# Patient Record
Sex: Male | Born: 1955 | Race: White | Hispanic: No | Marital: Single | Smoking: Never smoker
Health system: Southern US, Community
[De-identification: ages and names within clinical notes are randomized; demographics above are authoritative.]

## PROBLEM LIST (undated history)

## (undated) DIAGNOSIS — R569 Unspecified convulsions: Secondary | ICD-10-CM

## (undated) HISTORY — DX: Unspecified convulsions: R56.9

## (undated) HISTORY — PX: HAND SURGERY: SHX662

## (undated) HISTORY — PX: KNEE SURGERY: SHX244

---

## 1997-07-07 ENCOUNTER — Inpatient Hospital Stay (HOSPITAL_COMMUNITY): Admission: EM | Admit: 1997-07-07 | Discharge: 1997-07-09 | Payer: Self-pay | Admitting: Emergency Medicine

## 2004-09-30 ENCOUNTER — Inpatient Hospital Stay: Payer: Self-pay | Admitting: Internal Medicine

## 2004-09-30 ENCOUNTER — Other Ambulatory Visit: Payer: Self-pay

## 2006-06-01 IMAGING — CR ORBITS FOR FOREIGN BODY - 2 VIEW
1 series · 3 of 3 positions shown · non-contrast
Comparison: none

REASON FOR EXAM: MRI- Metal Shaving in Eye
COMMENTS:

PROCEDURE:     DXR - DXR ORBITS FOR MRI CLEARANCE  - October 03, 2004  [DATE]
RESULT:        No metallic foreign body is noted.   The patient has been
cleared for MRI.

[Series 1: view not recorded · 0.17mm/px · 3 of 3 slices shown]
[im 1/3]
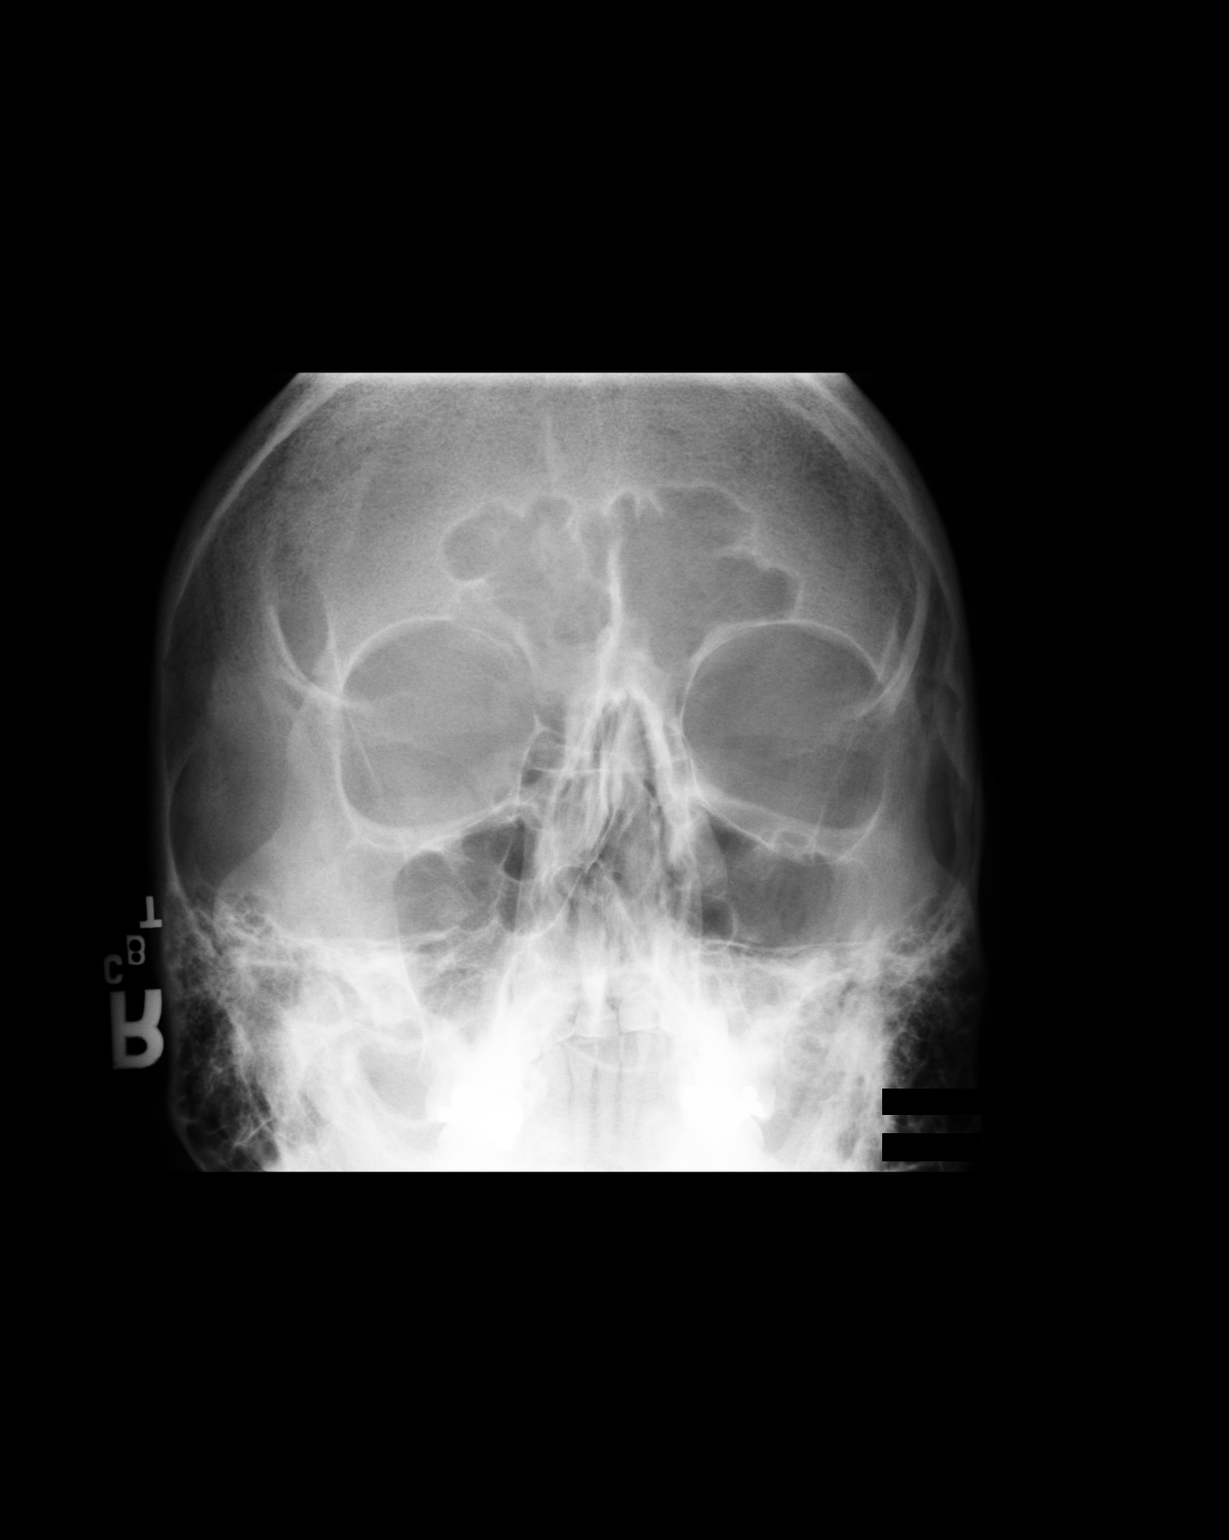
[im 2/3]
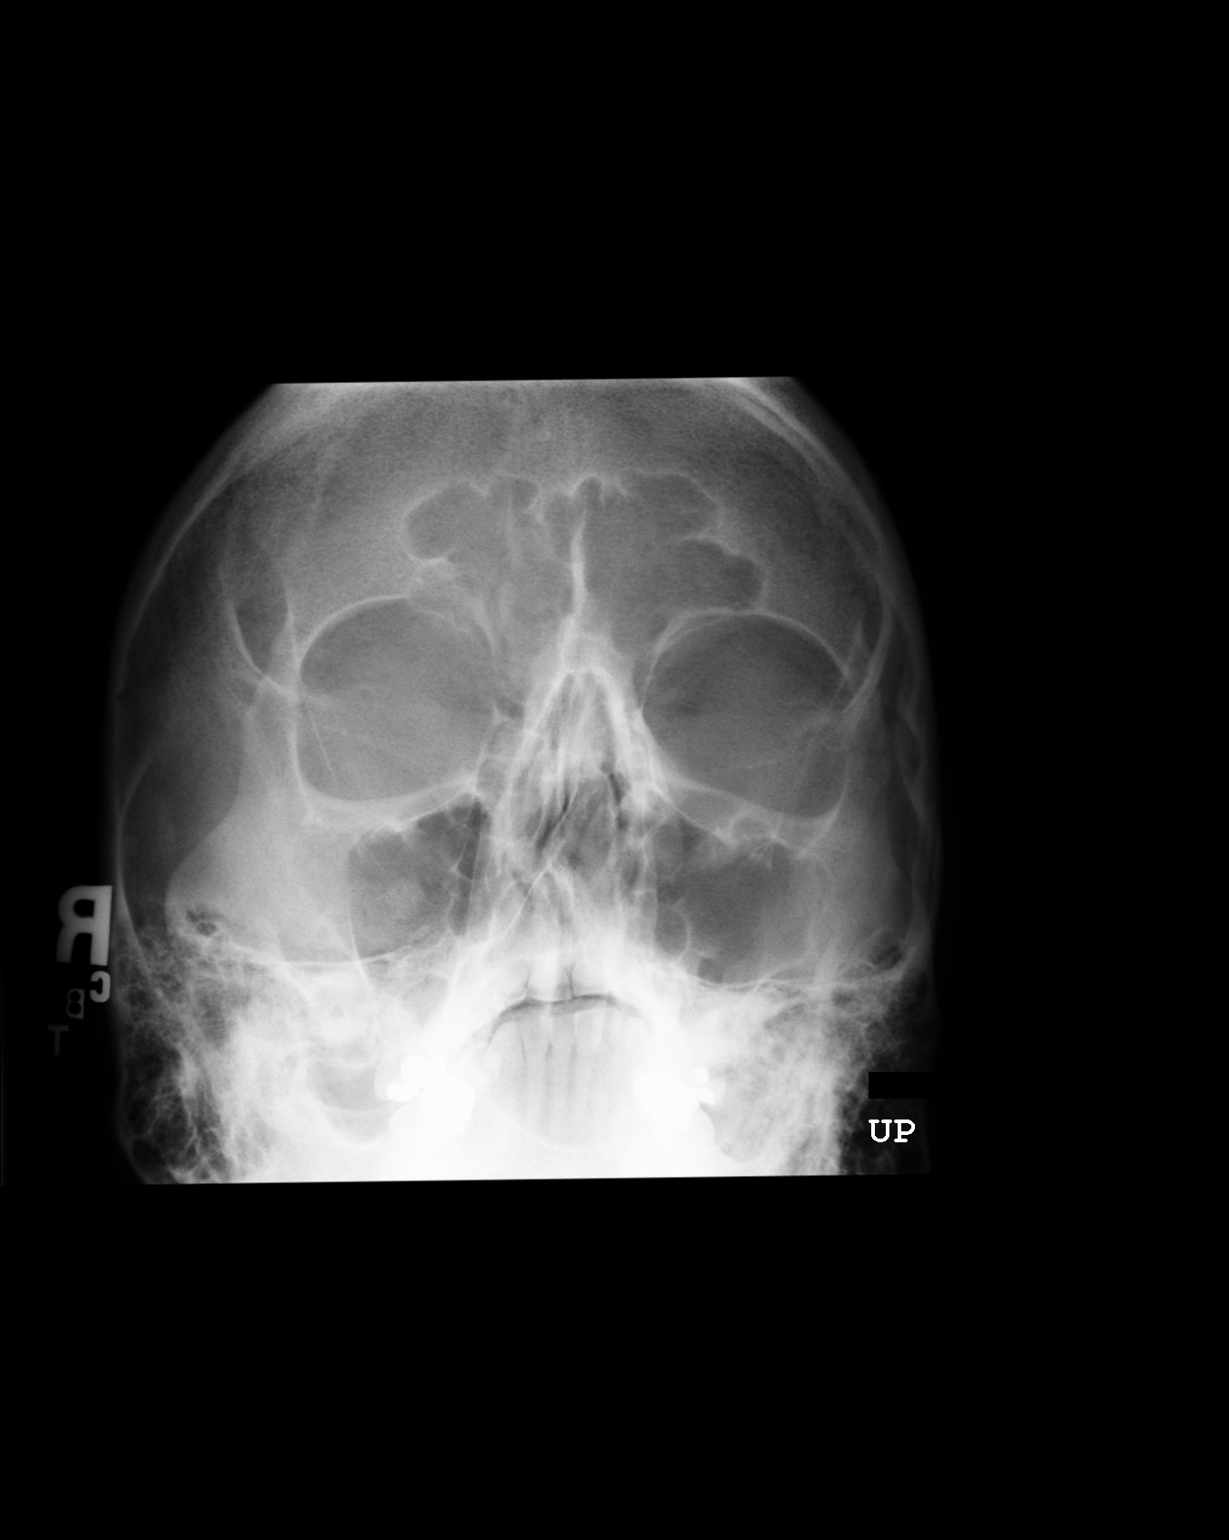
[im 3/3]
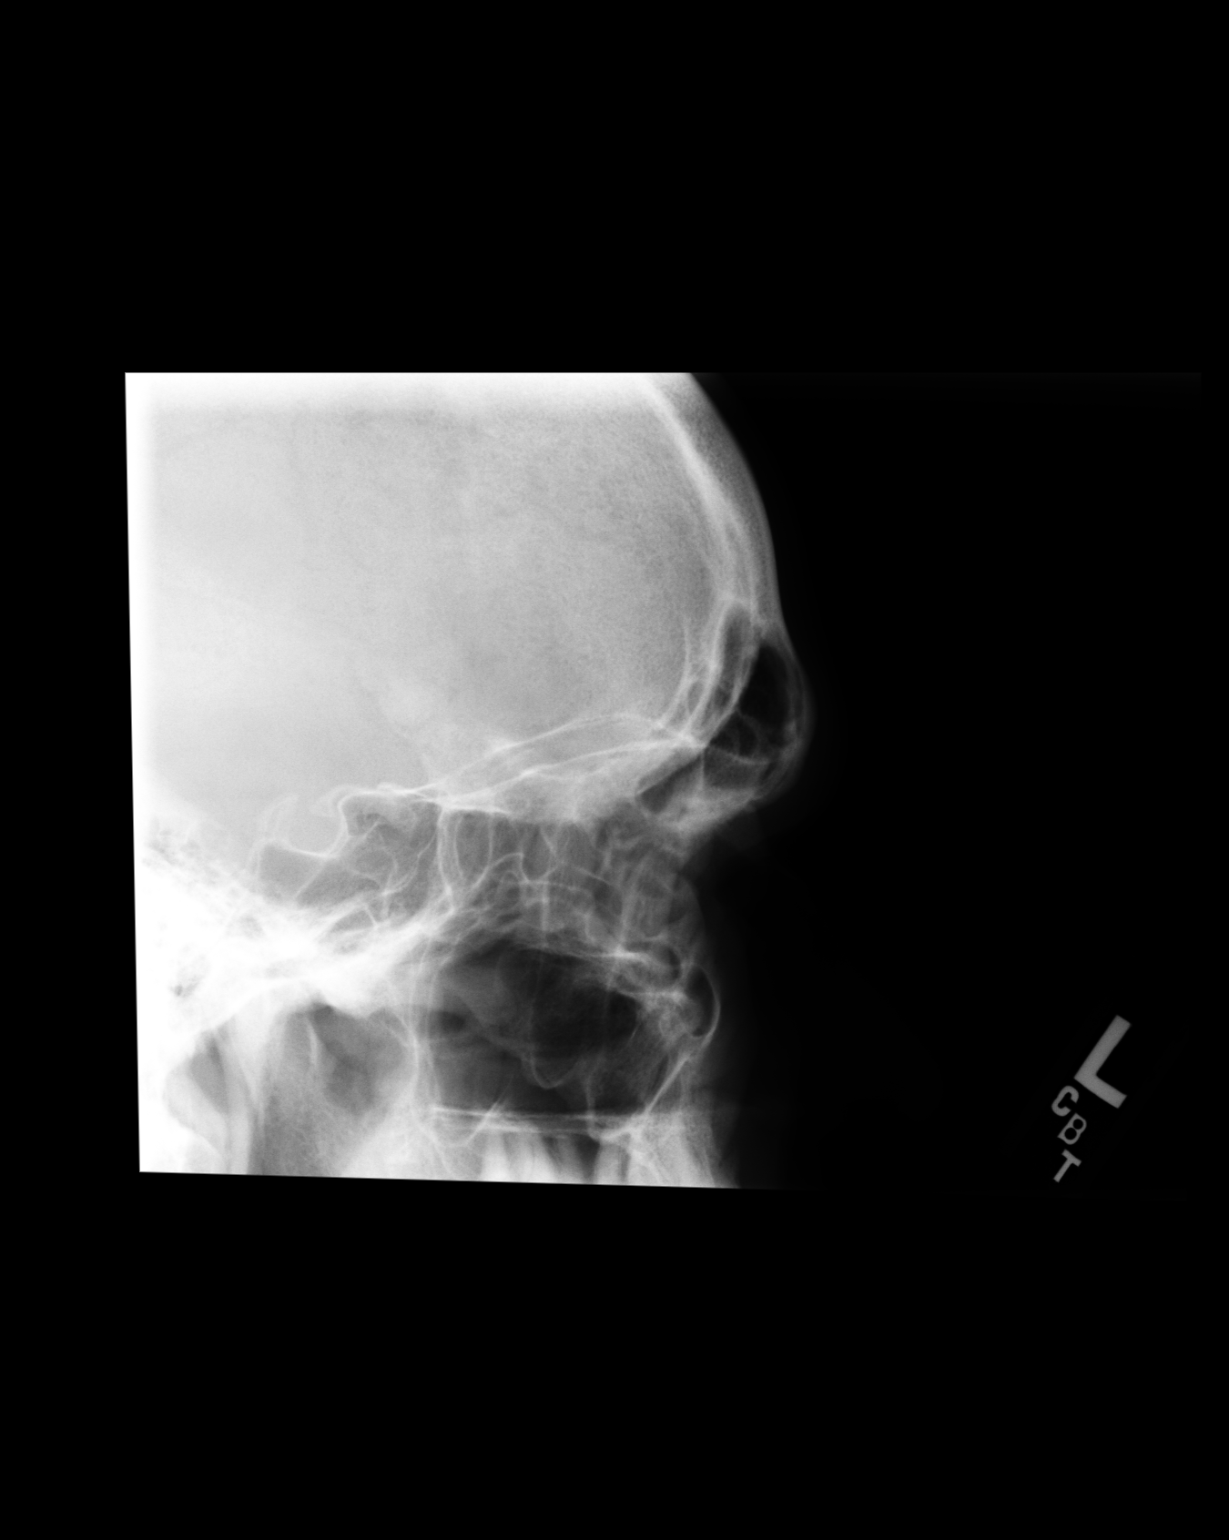

[3 of 3 positions shown; findings below may reference images not displayed]

IMPRESSION: See above.

## 2007-09-01 ENCOUNTER — Emergency Department (HOSPITAL_COMMUNITY): Admission: EM | Admit: 2007-09-01 | Discharge: 2007-09-01 | Payer: Self-pay | Admitting: Emergency Medicine

## 2010-04-26 ENCOUNTER — Encounter: Payer: Self-pay | Admitting: Family Medicine

## 2010-04-26 ENCOUNTER — Ambulatory Visit (INDEPENDENT_AMBULATORY_CARE_PROVIDER_SITE_OTHER): Payer: Medicare Other | Admitting: Family Medicine

## 2010-04-26 DIAGNOSIS — J0100 Acute maxillary sinusitis, unspecified: Secondary | ICD-10-CM | POA: Insufficient documentation

## 2010-05-05 NOTE — Assessment & Plan Note (Signed)
Summary: NOV sinusitis   Vital Signs:  Patient profile:   55 year old male Height:      69 inches Weight:      161 pounds BMI:     23.86 O2 Sat:      99 % on Room air Pulse rate:   82 / minute BP sitting:   148 / 93  (left arm) Cuff size:   large  Vitals Entered By: Payton Spark CMA (April 26, 2010 2:16 PM)  O2 Flow:  Room air CC: New to est. C/O nasal congestion and cough x 10 days.    Primary Care Provider:  Seymour Bars DO  CC:  New to est. C/O nasal congestion and cough x 10 days. Marland Kitchen  History of Present Illness: 55 yo WM presents for NOV.  He has a hx of seizure d/o since 2006.  He sees Dr Tera Helper at [facility] and is doing well, seizure - free x 2 yrs on Cabatrol and Zonegran.  He is a disabled Curator x 2 yrs.  He has hx of allergies but has not been taking anything.  About 10 days ago, he developed increased sinus congestion, HA and facial pain along iwth hallitosis and a thick colored rhinorrhea.  He has had sore throat, postnasal dip and rhinorrhea.  Ran a fever of 100 last weekend.      Current Medications (verified): 1)  Carbatrol 200 Mg Xr12h-Cap (Carbamazepine) .... Take 1 Cap By Mouth Every Morning and 2 Caps Every Evening 2)  Zonegran 100 Mg Caps (Zonisamide) .... Take 2 Caps By Mouth Every Morning and 2 Caps By Mouth Every Evening.  Allergies (verified): No Known Drug Allergies  Past History:  Past Medical History: seizure d/o  Past Surgical History: none  Family History: father died at 23 from AMI mother died of liver cancer sister healthy 1 brother with CAD < 56, other brother healthy  Social History: 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 Never smoked. Denies ETOH walks dog. Fair diet.  Review of Systems       no fevers/sweats/weakness, unexplained wt loss/gain, no change in vision, no difficulty hearing, ringing in ears, + hay fever/allergies, no CP/discomfort, no palpitations, no breast lump/nipple discharge, + cough/wheeze,  no blood in stool, no N/V/D, no nocturia, no leaking urine, no unusual vag bleeding, no vaginal/penile discharge, + muscle/joint pain, no rash, no new/changing mole, + HA, no memory loss, no anxiety, no sleep problem, no depression, no unexplained lumps, no easy bruising/bleeding, no concern with sexual function    Physical Exam  General:  alert, well-developed, well-nourished, and well-hydrated.   Head:  [place]/AT; maxillary sinus TTP Eyes:  conjunctiva clear Ears:  EACs patent; TMs translucent and gray with good cone of light and bony landmarks. on the L with cerumen impaction on the R Nose:  boggy turbinates, rhinorrhea Mouth:  pharynx pink and moist.  no injjection, hallitosis Neck:  no masses.   Lungs:  Normal respiratory effort, chest expands symmetrically. Lungs are clear to auscultation, no crackles or wheezes. Heart:  Normal rate and regular rhythm. S1 and S2 normal without gallop, murmur, click, rub or other extra sounds. Skin:  color normal.   Cervical Nodes:  shoddy anterior cervical chain LA   Impression & Recommendations:  Problem # 1:  ACUTE MAXILLARY SINUSITIS (ICD-461.0) Treat with Augmentin x 10 days along with Flonase daily x 2 wks and Benzonate for cough.   Avoid OTC cold meds with seizure d/o. RTC for f/u in 2  wks. His updated medication list for this problem includes:    Fluticasone Propionate 50 Mcg/act Susp (Fluticasone propionate) .Marland Kitchen... 2 sprays/ nostril daily    Amoxicillin-pot Clavulanate 875-125 Mg Tabs (Amoxicillin-pot clavulanate) .Marland Kitchen... 1 tab by mouth two times a day x 10 days    Benzonatate 200 Mg Caps (Benzonatate) .Marland Kitchen... 1 capsule by mouth three times a day as needed cough  Complete Medication List: 1)  Carbatrol 200 Mg Xr12h-cap (Carbamazepine) .... Take 1 cap by mouth every morning and 2 caps every evening 2)  Zonegran 100 Mg Caps (Zonisamide) .... Take 2 caps by mouth every morning and 2 caps by mouth every evening. 3)  Fluticasone Propionate 50 Mcg/act  Susp (Fluticasone propionate) .... 2 sprays/ nostril daily 4)  Amoxicillin-pot Clavulanate 875-125 Mg Tabs (Amoxicillin-pot clavulanate) .Marland Kitchen.. 1 tab by mouth two times a day x 10 days 5)  Benzonatate 200 Mg Caps (Benzonatate) .Marland Kitchen.. 1 capsule by mouth three times a day as needed cough  Patient Instructions: 1)  For sinusitis: 2)  Take 10 days of generic Augmentin -- one tab with breakfast and one tab with dinner. 3)  Use Flonase 2 sprays/ nostril once daily for the next 2 wks. 4)  Use Benzonate as needed for cough. 5)  Avoid OTC cold meds. 6)  Return for f/u in 2 wks. Prescriptions: BENZONATATE 200 MG CAPS (BENZONATATE) 1 capsule by mouth three times a day as needed cough  #30 x 0   Entered and Authorized by:   Seymour Bars DO   Signed by:   Seymour Bars DO on 04/26/2010   Method used:   Electronically to        [id number]* (retail)       [address], [place]  [address]       Ph: [phone]       Fax: [phone]   RxID:   [id number] AMOXICILLIN-POT CLAVULANATE 875-125 MG TABS (AMOXICILLIN-POT CLAVULANATE) 1 tab by mouth two times a day x 10 days  #20 x 0   Entered and Authorized by:   Seymour Bars DO   Signed by:   Seymour Bars DO on 04/26/2010   Method used:   Electronically to        [id number]* (retail)       [address], [place]  [address]       Ph: [phone]       Fax: [phone]   RxID:   [id number] FLUTICASONE PROPIONATE 50 MCG/ACT SUSP (FLUTICASONE PROPIONATE) 2 sprays/ nostril daily  #1 bottle x 2   Entered and Authorized by:   Seymour Bars DO   Signed by:   Seymour Bars DO on 04/26/2010   Method used:   Electronically to        [id number]* (retail)       [address], [place]  [address]       Ph: [phone]       Fax: [phone]   RxID:   [id number]    Orders Added: 1)  New Patient Level III [51761]

## 2010-05-10 ENCOUNTER — Encounter: Payer: Self-pay | Admitting: Family Medicine

## 2010-05-10 ENCOUNTER — Ambulatory Visit (INDEPENDENT_AMBULATORY_CARE_PROVIDER_SITE_OTHER): Payer: Medicare Other | Admitting: Family Medicine

## 2010-05-10 DIAGNOSIS — R030 Elevated blood-pressure reading, without diagnosis of hypertension: Secondary | ICD-10-CM | POA: Insufficient documentation

## 2010-05-10 DIAGNOSIS — J0100 Acute maxillary sinusitis, unspecified: Secondary | ICD-10-CM

## 2010-05-17 NOTE — Letter (Signed)
Summary: Intake Forms  Intake Forms   Imported By: Lanelle Bal 05/12/2010 10:38:27  _____________________________________________________________________  External Attachment:    Type:   Image     Comment:   External Document

## 2010-05-17 NOTE — Assessment & Plan Note (Signed)
Summary: F/U BP/ LABS   Vital Signs:  Patient profile:   55 year old male Height:      69 inches Weight:      159 pounds BMI:     23.57 O2 Sat:      100 % on Room air Pulse rate:   81 / minute BP sitting:   131 / 88  (left arm) Cuff size:   large  Vitals Entered By: Payton Spark CMA (May 10, 2010 9:44 AM)  O2 Flow:  Room air CC: F/U. Feeling much better   Primary Care Provider:  Seymour Bars DO  CC:  F/U. Feeling much better.  History of Present Illness: 55 year old male presents for follow up sinusitis and BP check.  He states his symptoms of headache, cough, and thick drainage have cleared up but he is still having mild rhinitis.  However, he is still using the flonase and is very happy with its effect.  He finished his course of antibiotics.    Doing well with seizure meds- has f/u with [facility].  Due for fasting labs and wants to set up a physical.  Current Medications (verified): 1)  Carbatrol 200 Mg Xr12h-Cap (Carbamazepine) .... Take 1 Cap By Mouth Every Morning and 2 Caps Every Evening 2)  Zonegran 100 Mg Caps (Zonisamide) .... Take 2 Caps By Mouth Every Morning and 2 Caps By Mouth Every Evening. 3)  Fluticasone Propionate 50 Mcg/act Susp (Fluticasone Propionate) .... 2 Sprays/ Nostril Daily  Allergies (verified): No Known Drug Allergies  Past History:  Past Medical History: Reviewed history from 04/26/2010 and no changes required. seizure d/o  Past Surgical History: Reviewed history from 04/26/2010 and no changes required. none  Social History: Reviewed history from 04/26/2010 and no changes required. 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 Never smoked. Denies ETOH walks dog. Fair diet.  Review of Systems      See HPI  Physical Exam  General:  alert, well-developed, and well-nourished.   Head:  normocephalic and atraumatic.  sinususes NTTP Eyes:  pupils equal, pupils round, and pupils reactive to light.  Conjuctiva  clear. Ears:  Right TM normal with good cone of light and visible bony landmarks.  Unable to visualize left TM due to cerumen. Nose:  Mild clear discharge but no erythema. Mouth:  Oropharynx pink and moist without exudate. Lungs:  normal respiratory effort, normal breath sounds, no crackles, and no wheezes.  normal respiratory effort, normal breath sounds, no crackles, and no wheezes.   Heart:  normal rate and regular rhythm.  normal rate and regular rhythm.   Pulses:  R radial normal and L radial normal.  R radial normal and L radial normal.   Cervical Nodes:  no anterior cervical adenopathy and no posterior cervical adenopathy.  no anterior cervical adenopathy and no posterior cervical adenopathy.     Impression & Recommendations:  Problem # 1:  ACUTE MAXILLARY SINUSITIS (ICD-461.0) Resolved with mild rhinitis.  Finished antibiotics, still on fluticasone nasal spray.  Can remain on nasal steroid as this appears to be beneficial to him.  The following medications were removed from the medication list:    Amoxicillin-pot Clavulanate 875-125 Mg Tabs (Amoxicillin-pot clavulanate) .Marland Kitchen... 1 tab by mouth two times a day x 10 days    Benzonatate 200 Mg Caps (Benzonatate) .Marland Kitchen... 1 capsule by mouth three times a day as needed cough His updated medication list for this problem includes:    Fluticasone Propionate 50 Mcg/act Susp (Fluticasone  propionate) .Marland Kitchen... 2 sprays/ nostril daily  Problem # 2:  ELEVATED BLOOD PRESSURE (ICD-796.2) BP has improved now that he is off cold meds and his sinusitis has cleared. Will update his fasting labs today and set up a CPE in 2 mos.  Complete Medication List: 1)  Carbatrol 200 Mg Xr12h-cap (Carbamazepine) .... Take 1 cap by mouth every morning and 2 caps every evening 2)  Zonegran 100 Mg Caps (Zonisamide) .... Take 2 caps by mouth every morning and 2 caps by mouth every evening. 3)  Fluticasone Propionate 50 Mcg/act Susp (Fluticasone propionate) .... 2 sprays/  nostril daily  Other Orders: T-Comprehensive Metabolic Panel ([id number]) T-PSA Total (Medicare Screen Only) [phone]) T-Lipid Profile (29528-41324)  Patient Instructions: 1)  Update fasting labs downstairs today. 2)  Will call you w/ result tomorrow. 3)  Return for a physical in 2 mos. 4)  I am glad you are feeling better!!   Orders Added: 1)  T-Comprehensive Metabolic Panel [[id number]] 2)  T-PSA Total (Medicare Screen Only) [[id number]] 3)  T-Lipid Profile [[id number]] 4)  Est. Patient Level III [40102]

## 2010-05-18 ENCOUNTER — Other Ambulatory Visit: Payer: Self-pay | Admitting: *Deleted

## 2010-05-18 ENCOUNTER — Telehealth: Payer: Self-pay | Admitting: Family Medicine

## 2010-05-18 ENCOUNTER — Encounter: Payer: Self-pay | Admitting: Family Medicine

## 2010-05-18 DIAGNOSIS — E785 Hyperlipidemia, unspecified: Secondary | ICD-10-CM

## 2010-05-18 LAB — CONVERTED CEMR LAB
ALT: 12 units/L (ref 0–53)
CO2: 25 meq/L (ref 19–32)
Calcium: 9.7 mg/dL (ref 8.4–10.5)
Chloride: 107 meq/L (ref 96–112)
Cholesterol: 223 mg/dL — ABNORMAL HIGH (ref 0–200)
Creatinine, Ser: 1.33 mg/dL (ref 0.40–1.50)
Glucose, Bld: 104 mg/dL — ABNORMAL HIGH (ref 70–99)
Sodium: 139 meq/L (ref 135–145)
Total Bilirubin: 0.5 mg/dL (ref 0.3–1.2)
Total Protein: 7.5 g/dL (ref 6.0–8.3)
Triglycerides: 128 mg/dL (ref <150)
VLDL: 26 mg/dL (ref 0–40)

## 2010-05-18 LAB — COMPREHENSIVE METABOLIC PANEL
Albumin: 5 g/dL (ref 3.5–5.2)
BUN: 21 mg/dL (ref 6–23)
CO2: 25 mEq/L (ref 19–32)
Calcium: 9.7 mg/dL (ref 8.4–10.5)
Glucose, Bld: 104 mg/dL — ABNORMAL HIGH (ref 70–99)
Potassium: 4.4 mEq/L (ref 3.5–5.3)
Sodium: 139 mEq/L (ref 135–145)
Total Protein: 7.5 g/dL (ref 6.0–8.3)

## 2010-05-18 LAB — LIPID PANEL
Cholesterol: 223 mg/dL — ABNORMAL HIGH (ref 0–200)
Total CHOL/HDL Ratio: 5.2 Ratio
Triglycerides: 128 mg/dL (ref <150)
VLDL: 26 mg/dL (ref 0–40)

## 2010-05-18 LAB — PSA, MEDICARE: PSA: 0.93 ng/mL (ref <=4.00)

## 2010-05-18 MED ORDER — PRAVASTATIN SODIUM 40 MG PO TABS: 40.0000 mg | ORAL_TABLET | Freq: Every day | ORAL | Status: DC

## 2010-05-18 NOTE — Telephone Encounter (Signed)
Pt aware of the above  

## 2010-05-18 NOTE — Telephone Encounter (Signed)
Pls let p tknow that his cholesterol was high and I started him on Pravastatin at bedtime, pick up from pharmacy. Call if any problems, o/w will recheck lab in 2 mos.

## 2010-06-20 ENCOUNTER — Telehealth: Payer: Self-pay | Admitting: Family Medicine

## 2010-06-20 NOTE — Telephone Encounter (Signed)
Pls let pt know that his prostate cancer screen came back normal.  His cholesterol is still high.  Pls ask if he's been taking Pravastatin every night.  Sguar is 104 in the pre diabetic range with normal liver and kidney function.  Work on [employer], regular exercise.  Repeat sugar and lipids in 6 mos.

## 2010-06-21 NOTE — Telephone Encounter (Signed)
LMOM informing Pt of the above 

## 2010-07-05 ENCOUNTER — Encounter: Payer: Self-pay | Admitting: Family Medicine

## 2010-07-05 ENCOUNTER — Ambulatory Visit (INDEPENDENT_AMBULATORY_CARE_PROVIDER_SITE_OTHER): Payer: Medicare Other | Admitting: Family Medicine

## 2010-07-05 VITALS — BP 120/81 | HR 62 | Temp 98.7°F | Ht 69.0 in | Wt 165.0 lb

## 2010-07-05 DIAGNOSIS — Z1211 Encounter for screening for malignant neoplasm of colon: Secondary | ICD-10-CM

## 2010-07-05 DIAGNOSIS — Z125 Encounter for screening for malignant neoplasm of prostate: Secondary | ICD-10-CM

## 2010-07-05 DIAGNOSIS — Z23 Encounter for immunization: Secondary | ICD-10-CM

## 2010-07-05 DIAGNOSIS — Z0000 Encounter for general adult medical examination without abnormal findings: Secondary | ICD-10-CM

## 2010-07-05 DIAGNOSIS — E785 Hyperlipidemia, unspecified: Secondary | ICD-10-CM

## 2010-07-05 MED ORDER — PNEUMOCOCCAL VAC POLYVALENT 25 MCG/0.5ML IJ INJ: 0.5000 mL | INJECTION | Freq: Once | INTRAMUSCULAR | Status: DC

## 2010-07-05 MED ORDER — TETANUS-DIPHTH-ACELL PERTUSSIS 5-2.5-18.5 LF-MCG/0.5 IM SUSP: 0.5000 mL | Freq: Once | INTRAMUSCULAR | Status: DC

## 2010-07-05 NOTE — Progress Notes (Signed)
Subjective:    Patient ID: Tyler Carlson, male    DOB: 03-30-1955, 55 y.o.   MRN: [id number]  HPI 55  yo WM presents for CPE.  He is healthy w/o any complaints.  He has a + fam hx for premature heart dz but neg for prostate cancer or colon cancer.    His last labwork was in March, started on cholesterol meds and had a glucose in the IFG range. His last tetanus vaccine was > 10 yrs ago, no known PNX with seizure d/o.  His last colonoscopy was never.  See scanned in medicare wellness form.    BP 120/81  Pulse 62  Temp(Src) 98.7 F (37.1 C) (Oral)  Ht 5\' 9"  (1.753 m)  Wt 165 lb (74.844 kg)  BMI 24.37 kg/m2  SpO2 99%  Past Medical History  Diagnosis Date  . Seizures     History reviewed. No pertinent past surgical history.  Family History  Problem Relation Age of Onset  . Cancer Mother   . Heart disease Father   . Heart disease Brother 56    CAD    History   Social History  . Marital Status: Single    Spouse Name: N/A    Number of Children: N/A  . Years of Education: N/A   Occupational History  . Not on file.   Social History Main Topics  . Smoking status: Never Smoker   . Smokeless tobacco: Not on file  . Alcohol Use: No  . Drug Use: Not on file  . Sexually Active: Not on file   Other Topics Concern  . Not on file   Social History Narrative  . No narrative on file    Allergies not on file  Current outpatient prescriptions:carbamazepine (CARBATROL) 200 MG 12 hr capsule, 2 (two) times daily. Take 1 cap po every morning and 2 caps every evening , Disp: , Rfl: ;  fluticasone (FLONASE) 50 MCG/ACT nasal spray, 2 sprays by Nasal route daily.  , Disp: , Rfl: ;  pravastatin (PRAVACHOL) 40 MG tablet, Take 1 tablet (40 mg total) by mouth at bedtime., Disp: 30 tablet, Rfl: 2 zonisamide (ZONEGRAN) 100 MG capsule, Take 2 caps po every morning and 2 caps po every evening , Disp: , Rfl:     Review of Systems  Constitutional: Negative for fatigue and unexpected  weight change.  Eyes: Negative for visual disturbance.  Respiratory: Negative for shortness of breath.   Cardiovascular: Negative for chest pain, palpitations and leg swelling.  Gastrointestinal: Positive for constipation. Negative for nausea, vomiting, diarrhea and blood in stool.  Genitourinary: Negative for difficulty urinating.  Neurological: Negative for headaches.  Psychiatric/Behavioral: Negative for dysphoric mood. The patient is not nervous/anxious.        Objective:   Physical Exam  Constitutional: He appears well-developed and well-nourished. No distress.  HENT:  Head: Normocephalic and atraumatic.  Eyes: Conjunctivae are normal. No scleral icterus.  Neck: Neck supple. No thyromegaly present.  Cardiovascular: Normal rate, regular rhythm, normal heart sounds and intact distal pulses.   No murmur heard. Pulmonary/Chest: Effort normal and breath sounds normal. No respiratory distress.  Abdominal: Soft. Bowel sounds are normal. He exhibits no distension and no mass. There is no guarding.       No aa bruits  Genitourinary: Rectum normal and prostate normal. Guaiac negative stool.  Musculoskeletal: He exhibits no edema.  Lymphadenopathy:    He has no cervical adenopathy.  Skin: Skin is warm and dry.  Psychiatric: He  has a normal mood and affect.          Assessment & Plan:

## 2010-07-05 NOTE — Patient Instructions (Signed)
Update labs downstairs in 2 wks (non fasting) for new start Pravastatin. Will call you w/ results.  Tdap and Pneumovax given today. EKG today.  Return for f/u visit/ set up stress test in 3-4 mos.  Colonoscopy will be scheduled.

## 2010-07-06 ENCOUNTER — Telehealth: Payer: Self-pay | Admitting: Family Medicine

## 2010-07-06 LAB — LDL CHOLESTEROL, DIRECT: Direct LDL: 129 mg/dL — ABNORMAL HIGH

## 2010-07-06 LAB — HEPATIC FUNCTION PANEL
Albumin: 5.3 g/dL — ABNORMAL HIGH (ref 3.5–5.2)
Total Protein: 7.7 g/dL (ref 6.0–8.3)

## 2010-07-06 NOTE — Telephone Encounter (Signed)
Pls let pt know that his LDL bad chol dropped from 161--> 129 (<130 is goal).  Liver enzymes are normal.  Continue Pravastatin at bedtime.

## 2010-07-07 NOTE — Telephone Encounter (Signed)
Pt aware of the above  

## 2010-07-12 ENCOUNTER — Ambulatory Visit (INDEPENDENT_AMBULATORY_CARE_PROVIDER_SITE_OTHER): Payer: Medicare Other | Admitting: Family Medicine

## 2010-07-12 ENCOUNTER — Encounter: Payer: Self-pay | Admitting: Family Medicine

## 2010-07-12 VITALS — BP 137/90 | HR 75 | Temp 97.7°F | Ht 69.0 in | Wt 166.0 lb

## 2010-07-12 DIAGNOSIS — H6120 Impacted cerumen, unspecified ear: Secondary | ICD-10-CM

## 2010-07-12 NOTE — Patient Instructions (Signed)
Will get you in with ENT to remove the remaining ear wax.  Do not use Q- tips.  OK to use OTC DEBROX for home ear irrigation once a month to prevent this.

## 2010-07-12 NOTE — Progress Notes (Signed)
  Subjective:    Patient ID: Tyler Carlson, male    DOB: Dec 14, 1955, 55 y.o.   MRN: [id number]  HPI  L ear clogged with wax x 1 wk, worse after using Q tips.  Can't hear well.  Denies dizziness, tinnitus or R ear problems.  Has some tenderness.  BP 137/90  Pulse 75  Temp(Src) 97.7 F (36.5 C) (Oral)  Ht 5\' 9"  (1.753 m)  Wt 166 lb (75.297 kg)  BMI 24.51 kg/m2  SpO2 99%   Review of Systems  as per HPI    Objective:   Physical Exam  R TM normal, EAC clear; L TM blocked by cerumen impaction.  Ear irrigation removed a large piece of cerum.  After using ear currette, he still has a deeper impaction that is tender up against the L TM.  EAC is normal. no external tenderness.      Assessment & Plan:  Cerumen impaction (L) - attempted irrigation but only removed part of impaction.  Has associated hearing loss. Will refer to ENT for microscopic removal.  Avoid Q tips.

## 2010-07-21 ENCOUNTER — Encounter: Payer: Self-pay | Admitting: Family Medicine

## 2010-08-05 ENCOUNTER — Encounter: Payer: Self-pay | Admitting: Family Medicine

## 2010-10-05 ENCOUNTER — Encounter: Payer: Self-pay | Admitting: Family Medicine

## 2010-10-05 ENCOUNTER — Ambulatory Visit (INDEPENDENT_AMBULATORY_CARE_PROVIDER_SITE_OTHER): Payer: Medicare Other | Admitting: Family Medicine

## 2010-10-05 VITALS — BP 128/85 | HR 66 | Ht 70.0 in | Wt 167.0 lb

## 2010-10-05 DIAGNOSIS — Z136 Encounter for screening for cardiovascular disorders: Secondary | ICD-10-CM

## 2010-10-05 DIAGNOSIS — R569 Unspecified convulsions: Secondary | ICD-10-CM

## 2010-10-05 DIAGNOSIS — E785 Hyperlipidemia, unspecified: Secondary | ICD-10-CM

## 2010-10-05 MED ORDER — PRAVASTATIN SODIUM 40 MG PO TABS: 40.0000 mg | ORAL_TABLET | Freq: Every day | ORAL | Status: DC

## 2010-10-05 NOTE — Progress Notes (Signed)
  Subjective:    Patient ID: Tyler Carlson, male    DOB: [birth year], 55 y.o.   MRN: [id number]  HPI  55 yo WM presents for f/u visit.  He saw ENT and had his ears cleaned out.  He has well controlled cholesterol due to RF his pravastatin.  His father had an AMI and died in his 32s.  His dad's side of the family has heart dz.  None of his sibblings are affected.  He had a stress test > 5 yrs ago.  Denies any CP or DOE with exercise.  He follows with [facility] for hx seizure d/o, on Carbemezepine and Zonegran.  He has not had a sz in 2 yrs.  BP 128/85  Pulse 66  Ht 5\' 10"  (1.778 m)  Wt 167 lb (75.751 kg)  BMI 23.96 kg/m2  SpO2 99%    Review of Systems  Constitutional: Negative for fatigue.  Respiratory: Negative for shortness of breath.   Cardiovascular: Negative for chest pain and palpitations.  Neurological: Negative for seizures.  Psychiatric/Behavioral: Negative for dysphoric mood.       Objective:   Physical Exam  Constitutional: He appears well-developed and well-nourished.  HENT:  Mouth/Throat: Oropharynx is clear and moist.  Neck: No thyromegaly present.  Cardiovascular: Normal rate, regular rhythm and normal heart sounds.   No murmur heard. Pulmonary/Chest: Effort normal and breath sounds normal.  Musculoskeletal: He exhibits no edema.          Assessment & Plan:  Seizure d/o - doing well on meds.  Labs are UTD and he has f/u with [facility]  High Cholesterol:  RFd pravastatin.  LDL and LFTs are UTD.  Continue healthy diet and regular exercise.  fam hx of premature heart dz:  Given his high cholesterol, strong fam hx, will proceed with an ETT.  F/u results.  OK to add ASA 81 mg/ day.

## 2010-10-05 NOTE — Patient Instructions (Signed)
BP looks good. Labs UTD.  Continue Pravastatin,  RFd today.  Add ASPIRIN 81 mg daily to help prevent heart attack.  Will set up your stress test.  Return for f/u in 6 mos.

## 2010-11-24 LAB — CARBAMAZEPINE LEVEL, TOTAL: Carbamazepine Lvl: 11.9

## 2010-11-24 LAB — POCT I-STAT, CHEM 8
Calcium, Ion: 1.18
Glucose, Bld: 100 — ABNORMAL HIGH
HCT: 48
Hemoglobin: 16.3
TCO2: 22

## 2011-03-16 ENCOUNTER — Encounter: Payer: Self-pay | Admitting: Family Medicine

## 2011-03-16 ENCOUNTER — Ambulatory Visit (INDEPENDENT_AMBULATORY_CARE_PROVIDER_SITE_OTHER): Payer: Medicare Other | Admitting: Family Medicine

## 2011-03-16 VITALS — BP 139/81 | HR 65 | Ht 70.0 in | Wt 170.0 lb

## 2011-03-16 DIAGNOSIS — G40909 Epilepsy, unspecified, not intractable, without status epilepticus: Secondary | ICD-10-CM

## 2011-03-16 NOTE — Progress Notes (Signed)
Patient ID: Tyler Carlson, male   DOB: August 20, 1955, 56 y.o.   MRN: [id number] Pt needed lab order only for neurology Patient not seen by doctor

## 2011-03-17 LAB — COMPLETE METABOLIC PANEL WITH GFR
AST: 11 U/L (ref 0–37)
Albumin: 4.7 g/dL (ref 3.5–5.2)
Alkaline Phosphatase: 73 U/L (ref 39–117)
BUN: 20 mg/dL (ref 6–23)
Potassium: 4.4 mEq/L (ref 3.5–5.3)
Sodium: 141 mEq/L (ref 135–145)
Total Bilirubin: 0.6 mg/dL (ref 0.3–1.2)

## 2011-03-17 LAB — CBC WITH DIFFERENTIAL/PLATELET
Basophils Absolute: 0 10*3/uL (ref 0.0–0.1)
Basophils Relative: 0 % (ref 0–1)
MCHC: 33 g/dL (ref 30.0–36.0)
Neutro Abs: 3.2 10*3/uL (ref 1.7–7.7)
Neutrophils Relative %: 58 % (ref 43–77)
RDW: 13.8 % (ref 11.5–15.5)

## 2011-03-19 NOTE — Patient Instructions (Signed)
None at this time.

## 2011-07-03 ENCOUNTER — Other Ambulatory Visit: Payer: Self-pay | Admitting: *Deleted

## 2011-07-03 DIAGNOSIS — E785 Hyperlipidemia, unspecified: Secondary | ICD-10-CM

## 2011-07-03 MED ORDER — PRAVASTATIN SODIUM 40 MG PO TABS: 40.0000 mg | ORAL_TABLET | Freq: Every day | ORAL | Status: DC

## 2011-07-05 ENCOUNTER — Encounter: Payer: Self-pay | Admitting: Family Medicine

## 2011-07-05 DIAGNOSIS — G40209 Localization-related (focal) (partial) symptomatic epilepsy and epileptic syndromes with complex partial seizures, not intractable, without status epilepticus: Secondary | ICD-10-CM | POA: Insufficient documentation

## 2011-07-11 ENCOUNTER — Encounter: Payer: Self-pay | Admitting: Family Medicine

## 2011-07-11 ENCOUNTER — Ambulatory Visit (INDEPENDENT_AMBULATORY_CARE_PROVIDER_SITE_OTHER): Payer: Medicare Other | Admitting: Family Medicine

## 2011-07-11 VITALS — BP 144/97 | HR 107 | Ht 70.0 in | Wt 176.0 lb

## 2011-07-11 DIAGNOSIS — Z5181 Encounter for therapeutic drug level monitoring: Secondary | ICD-10-CM

## 2011-07-11 DIAGNOSIS — Z13220 Encounter for screening for lipoid disorders: Secondary | ICD-10-CM

## 2011-07-11 DIAGNOSIS — G40909 Epilepsy, unspecified, not intractable, without status epilepticus: Secondary | ICD-10-CM

## 2011-07-11 DIAGNOSIS — R030 Elevated blood-pressure reading, without diagnosis of hypertension: Secondary | ICD-10-CM

## 2011-07-11 MED ORDER — AMBULATORY NON FORMULARY MEDICATION: Status: DC

## 2011-07-11 NOTE — Progress Notes (Signed)
  Subjective:    Patient ID: Tyler Carlson, male    DOB: [birth year], 56 y.o.   MRN: [id number]  HPI Seizure d/o-Here for bloodwork. He was recently seen by his neurologist at [facility]. He would like him to get a CBC with differential and a CMP. He brought in a water form with him today. He is also due for fasting cholesterol. He is taking his medications regularly. He does not drive but does ride a scooter. It sounds like his seizures have been well controlled. He was told to follow back up in one month with his neurologist.  He also had a home visit with a nurse from his insurance company. He brought in a list of things to make sure that he is up-to-date on. We will each item. He is interested in the shingles vaccines are given a prescription for this.   Review of Systems     Objective:   Physical Exam  Constitutional: He is oriented to person, place, and time. He appears well-developed and well-nourished.  HENT:  Head: Normocephalic and atraumatic.  Neck: Normal range of motion. Neck supple. No thyromegaly present.  Cardiovascular: Normal rate, regular rhythm and normal heart sounds.        No carotid or abdominal bruits.  Pulmonary/Chest: Effort normal and breath sounds normal.  Lymphadenopathy:    He has no cervical adenopathy.  Neurological: He is alert and oriented to person, place, and time.  Skin: Skin is warm and dry.  Psychiatric: He has a normal mood and affect. His behavior is normal.          Assessment & Plan:  Elevated BP blood pressure is elevated today and one prior high reading. We discussed working on low salt diet, getting some regular exercise and followup in one month. He says he really wants to try this I offered blood pressure medication. We will check a CMP and make sure that kidney function is normal as well as a TSH to rule out thyroid problems.  Preventative care-we did go over his vaccines Melvern Banker. He is really up to date except for a recent  cholesterol which we will get this week and a recent kidney function which we will also get this week. He is due for shingles vaccine. He was given a prescription to take to the pharmacy since it is covered under his Medicare part D.  Seizure disorder-will send a copy of the blood work to his neurologist at [facility] since he is on carbamazepine.They wanted a CBC with differential and a CMP.

## 2011-07-11 NOTE — Patient Instructions (Addendum)

## 2011-07-17 LAB — CBC WITH DIFFERENTIAL/PLATELET
Basophils Absolute: 0 10*3/uL (ref 0.0–0.1)
Basophils Relative: 0 % (ref 0–1)
Eosinophils Absolute: 0.1 10*3/uL (ref 0.0–0.7)
Lymphs Abs: 1.5 10*3/uL (ref 0.7–4.0)
MCH: 31.6 pg (ref 26.0–34.0)
MCHC: 33.7 g/dL (ref 30.0–36.0)
Neutrophils Relative %: 60 % (ref 43–77)
Platelets: 245 10*3/uL (ref 150–400)
RBC: 5.22 MIL/uL (ref 4.22–5.81)
RDW: 14.3 % (ref 11.5–15.5)

## 2011-07-18 LAB — COMPLETE METABOLIC PANEL WITH GFR
ALT: 12 U/L (ref 0–53)
AST: 14 U/L (ref 0–37)
Alkaline Phosphatase: 79 U/L (ref 39–117)
Creat: 1.42 mg/dL — ABNORMAL HIGH (ref 0.50–1.35)
Sodium: 145 mEq/L (ref 135–145)
Total Bilirubin: 0.3 mg/dL (ref 0.3–1.2)
Total Protein: 7.1 g/dL (ref 6.0–8.3)

## 2011-07-18 LAB — LIPID PANEL
Cholesterol: 203 mg/dL — ABNORMAL HIGH (ref 0–200)
HDL: 43 mg/dL (ref >39)
Total CHOL/HDL Ratio: 4.7 Ratio
VLDL: 31 mg/dL (ref 0–40)

## 2011-07-19 ENCOUNTER — Telehealth: Payer: Self-pay | Admitting: Family Medicine

## 2011-07-19 ENCOUNTER — Other Ambulatory Visit: Payer: Self-pay | Admitting: Family Medicine

## 2011-07-19 DIAGNOSIS — E785 Hyperlipidemia, unspecified: Secondary | ICD-10-CM

## 2011-07-19 MED ORDER — PRAVASTATIN SODIUM 80 MG PO TABS: 80.0000 mg | ORAL_TABLET | Freq: Every day | ORAL | Status: DC

## 2011-07-19 NOTE — Telephone Encounter (Signed)
Please call [facility] and see if they can add a tegretol level to the labwork.

## 2011-07-19 NOTE — Telephone Encounter (Signed)
Called to add labs and pt's neurologist has already order this. [facility] is faxing the results

## 2011-07-25 ENCOUNTER — Telehealth: Payer: Self-pay | Admitting: *Deleted

## 2011-07-25 DIAGNOSIS — R899 Unspecified abnormal finding in specimens from other organs, systems and tissues: Secondary | ICD-10-CM

## 2011-07-27 LAB — BASIC METABOLIC PANEL WITH GFR
BUN: 26 mg/dL — ABNORMAL HIGH (ref 6–23)
Chloride: 108 mEq/L (ref 96–112)
Glucose, Bld: 87 mg/dL (ref 70–99)
Potassium: 4.3 mEq/L (ref 3.5–5.3)
Sodium: 140 mEq/L (ref 135–145)

## 2011-08-03 ENCOUNTER — Encounter: Payer: Self-pay | Admitting: *Deleted

## 2011-08-11 ENCOUNTER — Ambulatory Visit (INDEPENDENT_AMBULATORY_CARE_PROVIDER_SITE_OTHER): Payer: Medicare Other | Admitting: Family Medicine

## 2011-08-11 ENCOUNTER — Encounter: Payer: Self-pay | Admitting: Family Medicine

## 2011-08-11 VITALS — BP 138/85 | HR 65 | Ht 70.0 in | Wt 171.0 lb

## 2011-08-11 DIAGNOSIS — I10 Essential (primary) hypertension: Secondary | ICD-10-CM

## 2011-08-11 DIAGNOSIS — E785 Hyperlipidemia, unspecified: Secondary | ICD-10-CM

## 2011-08-11 MED ORDER — LISINOPRIL 5 MG PO TABS: 5.0000 mg | ORAL_TABLET | Freq: Every day | ORAL | Status: DC

## 2011-08-11 MED ORDER — ROSUVASTATIN CALCIUM 10 MG PO TABS: 10.0000 mg | ORAL_TABLET | Freq: Every day | ORAL | Status: DC

## 2011-08-11 NOTE — Progress Notes (Signed)
  Subjective:    Patient ID: Tyler Carlson, male    DOB: 02/15/1956, 56 y.o.   MRN: [id number]  HPI HTN - N oCP or SOB. No on med. Was working on diet and exercise to bring it down.   Hyperlipidemia - As soon as we increased his pravastin to 80mg  started getting up to pee 3-5 times at night.  Not bothrine him during the day. No dysuria or hematuria.  No rio hx of prostate problems.    Review of Systems     Objective:   Physical Exam  Constitutional: He is oriented to person, place, and time. He appears well-developed and well-nourished.  HENT:  Head: Normocephalic and atraumatic.  Cardiovascular: Normal rate, regular rhythm and normal heart sounds.   Pulmonary/Chest: Effort normal and breath sounds normal.  Neurological: He is alert and oriented to person, place, and time.  Skin: Skin is warm and dry.  Psychiatric: He has a normal mood and affect. His behavior is normal.          Assessment & Plan:  HTN - New dx. Start lisinopril daily.  Keep up with exercise and low salt diet. Check BMP at fu in 1 mo.   Hyperlipidemia - change to crestor 10 and see if improvement in urinary freq on this med. Check lipids panel at f/u.If not helping then consider UTI or BPH.

## 2011-09-08 ENCOUNTER — Ambulatory Visit: Payer: Medicare Other | Admitting: Family Medicine

## 2011-09-08 ENCOUNTER — Encounter: Payer: Self-pay | Admitting: Family Medicine

## 2011-09-08 ENCOUNTER — Ambulatory Visit (INDEPENDENT_AMBULATORY_CARE_PROVIDER_SITE_OTHER): Payer: Medicare Other | Admitting: Family Medicine

## 2011-09-08 VITALS — BP 128/85 | HR 67 | Ht 70.0 in | Wt 169.0 lb

## 2011-09-08 DIAGNOSIS — E785 Hyperlipidemia, unspecified: Secondary | ICD-10-CM | POA: Insufficient documentation

## 2011-09-08 DIAGNOSIS — I10 Essential (primary) hypertension: Secondary | ICD-10-CM

## 2011-09-08 MED ORDER — SIMVASTATIN 40 MG PO TABS: 40.0000 mg | ORAL_TABLET | Freq: Every evening | ORAL | Status: DC

## 2011-09-08 MED ORDER — LISINOPRIL 5 MG PO TABS: 5.0000 mg | ORAL_TABLET | Freq: Every day | ORAL | Status: DC

## 2011-09-08 NOTE — Patient Instructions (Signed)
Please call in 2 months for a lab slip to recheck her cholesterol and liver enzymes. Followup in 6 months to see me to recheck her blood pressure.

## 2011-09-08 NOTE — Progress Notes (Signed)
  Subjective:    Patient ID: Tyler Carlson, male    DOB: Aug 03, 1955, 56 y.o.   MRN: [id number]  HPI HTN - no chest pain or shortness of breath. Tolerating the new blood pressure pill well. No side effects.  Hyperlipidemia - he was having nocturia with the Lipitor so we switched him to Crestor. He says the nocturia has resolved and he is feeling great on the Crestor. Unfortunately it cost him $45 for the 30 day supply. He would like something cheaper if at all possible.   Review of Systems     Objective:   Physical Exam  Constitutional: He is oriented to person, place, and time. He appears well-developed and well-nourished.  HENT:  Head: Normocephalic and atraumatic.  Cardiovascular: Normal rate, regular rhythm and normal heart sounds.   Pulmonary/Chest: Effort normal and breath sounds normal.  Neurological: He is alert and oriented to person, place, and time.  Skin: Skin is warm and dry.  Psychiatric: He has a normal mood and affect. His behavior is normal.          Assessment & Plan:  HTN - Well controlled.  Doing well on lisinopril.  F/U in 6 months. Check BMP in 2 months when check his chol.   Hyperlipidemia - Due to recheck your choleterol and liver in 2 months. Complete the new Crestor and then change to simvastatin. Then check chol in 2 montns.

## 2011-11-09 ENCOUNTER — Telehealth: Payer: Self-pay | Admitting: Family Medicine

## 2011-11-09 DIAGNOSIS — E785 Hyperlipidemia, unspecified: Secondary | ICD-10-CM

## 2011-11-09 NOTE — Telephone Encounter (Signed)
Labs sent

## 2011-11-14 LAB — LIPID PANEL
HDL: 36 mg/dL — ABNORMAL LOW (ref >39)
LDL Cholesterol: 88 mg/dL (ref 0–99)
Triglycerides: 126 mg/dL (ref <150)
VLDL: 25 mg/dL (ref 0–40)

## 2011-11-14 LAB — HEPATIC FUNCTION PANEL
ALT: 14 U/L (ref 0–53)
Alkaline Phosphatase: 76 U/L (ref 39–117)
Bilirubin, Direct: 0.1 mg/dL (ref 0.0–0.3)
Indirect Bilirubin: 0.4 mg/dL (ref 0.0–0.9)
Total Protein: 6.8 g/dL (ref 6.0–8.3)

## 2012-01-01 ENCOUNTER — Other Ambulatory Visit: Payer: Self-pay | Admitting: Family Medicine

## 2012-02-01 ENCOUNTER — Other Ambulatory Visit: Payer: Self-pay

## 2012-02-01 DIAGNOSIS — E785 Hyperlipidemia, unspecified: Secondary | ICD-10-CM

## 2012-02-01 MED ORDER — SIMVASTATIN 40 MG PO TABS: 40.0000 mg | ORAL_TABLET | Freq: Every day | ORAL | Status: DC

## 2012-03-11 ENCOUNTER — Ambulatory Visit (INDEPENDENT_AMBULATORY_CARE_PROVIDER_SITE_OTHER): Payer: Medicare Other | Admitting: Family Medicine

## 2012-03-11 ENCOUNTER — Encounter: Payer: Self-pay | Admitting: Family Medicine

## 2012-03-11 VITALS — BP 123/72 | HR 67 | Resp 18 | Wt 165.0 lb

## 2012-03-11 DIAGNOSIS — L309 Dermatitis, unspecified: Secondary | ICD-10-CM

## 2012-03-11 DIAGNOSIS — I10 Essential (primary) hypertension: Secondary | ICD-10-CM

## 2012-03-11 DIAGNOSIS — Z23 Encounter for immunization: Secondary | ICD-10-CM

## 2012-03-11 DIAGNOSIS — E785 Hyperlipidemia, unspecified: Secondary | ICD-10-CM

## 2012-03-11 DIAGNOSIS — L259 Unspecified contact dermatitis, unspecified cause: Secondary | ICD-10-CM

## 2012-03-11 DIAGNOSIS — G40209 Localization-related (focal) (partial) symptomatic epilepsy and epileptic syndromes with complex partial seizures, not intractable, without status epilepticus: Secondary | ICD-10-CM

## 2012-03-11 NOTE — Patient Instructions (Signed)
Try Cereve pr Aquaphor, on your ankles and lower legs to help with the dry skin on your ankle

## 2012-03-11 NOTE — Assessment & Plan Note (Signed)
His meds were recently switched to generic and he is doing well on this.

## 2012-03-11 NOTE — Progress Notes (Signed)
  Subjective:    Patient ID: Tyler Carlson, male    DOB: May 08, 1955, 57 y.o.   MRN: [id number]  HPI HTN-  Pt denies chest pain, SOB, dizziness, or heart palpitations.  Taking meds as directed w/o problems.  Denies medication side effects.    Hyperlipidemia-he's tolerating simvastatin well. He had side effects with Lipitor and Crestor was too expensive. He says he's not had any myalgias or other concerns and has been affordable.  He also has a rash near his ankles. Says the skin will get very itchy at times.  No treatmentts tried. He thought it might be flea bites initially but says never saw any bumps.      Review of Systems     Objective:   Physical Exam  Constitutional: He is oriented to person, place, and time. He appears well-developed and well-nourished.  HENT:  Head: Normocephalic and atraumatic.  Cardiovascular: Normal rate, regular rhythm and normal heart sounds.   Pulmonary/Chest: Effort normal and breath sounds normal.  Neurological: He is alert and oriented to person, place, and time.  Skin: Skin is warm and dry.       He has alof  Of dry skin around his ankles but no actual rash.   Psychiatric: He has a normal mood and affect. His behavior is normal.          Assessment & Plan:  HTN- Well controlled.  Continue current regimen.  F/u in 6 months  Hyperlpidemia - well-controlled on simvastatin. We did check his labs back in September on the medication and then look fantastic. Lab Results  Component Value Date   CHOL 149 11/14/2011   HDL 36* 11/14/2011   LDLCALC 88 11/14/2011   LDLDIRECT 129* 07/05/2010   TRIG 126 11/14/2011   CHOLHDL 4.1 11/14/2011    Eczema - recommend try Cerave or Aquaphor OTC first right after shower. Let me know if not helping.  Consider topical steroid if not helping.

## 2012-03-27 ENCOUNTER — Other Ambulatory Visit: Payer: Self-pay | Admitting: *Deleted

## 2012-03-27 MED ORDER — LISINOPRIL 5 MG PO TABS: 5.0000 mg | ORAL_TABLET | Freq: Every day | ORAL | Status: DC

## 2012-03-31 ENCOUNTER — Other Ambulatory Visit: Payer: Self-pay | Admitting: Family Medicine

## 2012-07-23 ENCOUNTER — Other Ambulatory Visit: Payer: Self-pay | Admitting: Family Medicine

## 2012-09-09 ENCOUNTER — Encounter: Payer: Self-pay | Admitting: Family Medicine

## 2012-09-09 ENCOUNTER — Ambulatory Visit (INDEPENDENT_AMBULATORY_CARE_PROVIDER_SITE_OTHER): Payer: Self-pay | Admitting: Family Medicine

## 2012-09-09 VITALS — BP 110/75 | HR 86 | Ht 70.0 in | Wt 158.0 lb

## 2012-09-09 DIAGNOSIS — E785 Hyperlipidemia, unspecified: Secondary | ICD-10-CM

## 2012-09-09 DIAGNOSIS — G4700 Insomnia, unspecified: Secondary | ICD-10-CM

## 2012-09-09 DIAGNOSIS — I10 Essential (primary) hypertension: Secondary | ICD-10-CM

## 2012-09-09 NOTE — Patient Instructions (Signed)

## 2012-09-09 NOTE — Progress Notes (Signed)
Subjective:    Patient ID: Tyler Carlson, male    DOB: 1955-06-21, 57 y.o.   MRN: [id number]  HPI HTN-  Pt denies chest pain, SOB, dizziness, or heart palpitations.  Taking meds as directed w/o problems.  Denies medication side effects.    Hyperlipidemia  - no myalgia or S.E.  Regular exercise.   Lab Results  Component Value Date   CHOL 149 11/14/2011   HDL 36* 11/14/2011   LDLCALC 88 11/14/2011   LDLDIRECT 129* 07/05/2010   TRIG 126 11/14/2011   CHOLHDL 4.1 11/14/2011   He and his neuro have been weaning down on his carbamepine. He is now down to one a day and says had had a really hard time sleeping since starting weaning.  Occ feeling dizzy. No caffeine after lunch.  Says sleep environment is cool, dark, and quiet.  Has f/u with Neuro in August.  He has not tried anything over-the-counter because he was fearful about mixing medications. He says it is about to drive him crazy they cannot get a good night sleep.  Review of Systems     BP 110/75  Pulse 86  Ht 5\' 10"  (1.778 m)  Wt 158 lb (71.668 kg)  BMI 22.67 kg/m2    Allergies  Allergen Reactions  . Lipitor (Atorvastatin) Other (See Comments)    Inc urinary frequency.     Past Medical History  Diagnosis Date  . Seizures     No past surgical history on file.  History   Social History  . Marital Status: Single    Spouse Name: N/A    Number of Children: N/A  . Years of Education: N/A   Occupational History  . Not on file.   Social History Main Topics  . Smoking status: Never Smoker   . Smokeless tobacco: Not on file  . Alcohol Use: No  . Drug Use: No  . Sexually Active: Not on file   Other Topics Concern  . Not on file   Social History Narrative  . No narrative on file    Family History  Problem Relation Age of Onset  . Cancer Mother   . Heart disease Father   . Heart disease Brother 44    CAD    Outpatient Encounter Prescriptions as of 09/09/2012  Medication Sig Dispense Refill  . carbamazepine  (CARBATROL) 200 MG 12 hr capsule 2 (two) times daily. Take 1 cap po every morning and 2 caps every evening       . lisinopril (PRINIVIL,ZESTRIL) 5 MG tablet Take 1 tablet (5 mg total) by mouth daily.  90 tablet  1  . simvastatin (ZOCOR) 40 MG tablet TAKE 1 TABLET (40 MG TOTAL) BY MOUTH AT BEDTIME.  30 tablet  3  . zonisamide (ZONEGRAN) 100 MG capsule Take 2 caps po every morning and 2 caps po every evening       . [DISCONTINUED] fluticasone (FLONASE) 50 MCG/ACT nasal spray 2 sprays by Nasal route daily.        . [DISCONTINUED] simvastatin (ZOCOR) 40 MG tablet Take 1 tablet (40 mg total) by mouth at bedtime.  30 tablet  1   No facility-administered encounter medications on file as of 09/09/2012.       Objective:   Physical Exam  Constitutional: He is oriented to person, place, and time. He appears well-developed and well-nourished.  HENT:  Head: Normocephalic and atraumatic.  Cardiovascular: Normal rate, regular rhythm and normal heart sounds.   Pulmonary/Chest: Effort normal and  breath sounds normal.  Neurological: He is alert and oriented to person, place, and time.  Skin: Skin is warm and dry.  Psychiatric: He has a normal mood and affect. His behavior is normal.          Assessment & Plan:  HTN- well controlled. F/u in 6 months.  Due for CMP  Hyperlipidemia - Doing well. Will check labd in August.    Insmonia  - we discussed avoiding caffeine after lunch and making sure that his sleep environment school, quiet, and dark. He can also try over-the-counter Benadryl that he has not tried anything yet. Certainly this could be the weaning of the carbamazepine which is triggering his sleep issues. I encouraged him to try going back up to twice a day on his dosing for couple of days just to see if it improves his sleep. If it does I encouraged him to call his neurologist to see if she would be okay with him staying on the twice a day dose at least until he follows up. If it is not helpful  then I encouraged him to call the office back here.

## 2012-09-24 ENCOUNTER — Telehealth: Payer: Self-pay | Admitting: *Deleted

## 2012-09-24 NOTE — Telephone Encounter (Signed)
Pt called and wanted to know how long he had to fast prior to having labs drawn. I told him 8 hours water, black coffee no sugar, no cream, no artificial sweeteners. Unsweetened tea. And I also informed him of the hours of the lab 8-5 m-f closed from 12-1. Pt voiced understanding.Laureen Ochs, Viann Shove

## 2012-09-26 ENCOUNTER — Other Ambulatory Visit: Payer: Self-pay | Admitting: Family Medicine

## 2013-03-21 ENCOUNTER — Other Ambulatory Visit: Payer: Self-pay | Admitting: Family Medicine

## 2013-04-18 ENCOUNTER — Other Ambulatory Visit: Payer: Self-pay | Admitting: Family Medicine

## 2013-04-22 ENCOUNTER — Other Ambulatory Visit: Payer: Self-pay | Admitting: Family Medicine

## 2016-10-18 ENCOUNTER — Emergency Department (INDEPENDENT_AMBULATORY_CARE_PROVIDER_SITE_OTHER)
Admission: EM | Admit: 2016-10-18 | Discharge: 2016-10-18 | Disposition: A | Discharge status: Home / Self Care | Payer: Medicare HMO | Source: Home / Self Care | Attending: Family Medicine | Admitting: Family Medicine

## 2016-10-18 ENCOUNTER — Encounter: Payer: Self-pay | Admitting: *Deleted

## 2016-10-18 DIAGNOSIS — S61211A Laceration without foreign body of left index finger without damage to nail, initial encounter: Secondary | ICD-10-CM | POA: Diagnosis not present

## 2016-10-18 NOTE — ED Provider Notes (Signed)
[facility]    CSN: [id number] Arrival date & time: 10/18/16  1146     History   Chief Complaint Chief Complaint  Patient presents with  . Laceration    HPI Tyler Carlson is a 61 y.o. male.    HPI  Tyler Carlson is a 61 y.o. male presenting to UC with c/o laceration to his Left index finger that occurred around 10AM this morning by accidentally cutting his finger with a hedge trimmer.  Bleeding controlled PTA.  Pain is minimal. He is not on blood thinners. He is Right hand dominant. Last tetanus: within the last 1 year.    Past Medical History:  Diagnosis Date  . Seizures [facility])     Patient Active Problem List   Diagnosis Date Noted  . Essential hypertension, benign 09/08/2011  . Other and unspecified hyperlipidemia 09/08/2011  . Seizure disorder, complex partial (HCC) 07/05/2011    Past Surgical History:  Procedure Laterality Date  . HAND SURGERY Right   . KNEE SURGERY Right        Home Medications    Prior to Admission medications   Medication Sig Start Date End Date Taking? Authorizing Provider  simvastatin (ZOCOR) 40 MG tablet TAKE 1 TABLET (40 MG TOTAL) BY MOUTH AT BEDTIME. 07/23/12  Yes Agapito Games, MD  zonisamide (ZONEGRAN) 100 MG capsule Take 2 caps po every morning and 2 caps po every evening    Yes [provider]  carbamazepine (CARBATROL) 200 MG 12 hr capsule 2 (two) times daily. Take 1 cap po every morning and 2 caps every evening     [provider]  lisinopril (PRINIVIL,ZESTRIL) 5 MG tablet TAKE 1 TABLET (5 MG TOTAL) BY MOUTH DAILY. 03/21/13   Agapito Games, MD    Family History Family History  Problem Relation Age of Onset  . Cancer Mother   . Heart disease Father   . Heart disease Brother 52       CAD    Social History Social History  Substance Use Topics  . Smoking status: Never Smoker  . Smokeless tobacco: Never Used  . Alcohol use No     Allergies   Lipitor  [atorvastatin]   Review of Systems Review of Systems  Musculoskeletal: Negative for arthralgias, joint swelling and myalgias.  Skin: Positive for wound. Negative for color change.     Physical Exam Triage Vital Signs ED Triage Vitals  Enc Vitals Group     BP 10/18/16 1206 131/83     Pulse Rate 10/18/16 1206 63     Resp 10/18/16 1206 16     Temp 10/18/16 1206 (!) 97.4 F (36.3 C)     Temp Source 10/18/16 1206 Oral     SpO2 10/18/16 1206 97 %     Weight 10/18/16 1207 149 lb (67.6 kg)     Height 10/18/16 1207 5\' 9"  (1.753 m)     Head Circumference --      Peak Flow --      Pain Score 10/18/16 1207 2     Pain Loc --      Pain Edu? --      Excl. in GC? --    No data found.   Updated Vital Signs BP 131/83 (BP Location: Left Arm)   Pulse 63   Temp (!) 97.4 F (36.3 C) (Oral)   Resp 16   Ht 5\' 9"  (1.753 m)   Wt 149 lb (67.6 kg)   SpO2 97%  BMI 22.00 kg/m   Visual Acuity Right Eye Distance:   Left Eye Distance:   Bilateral Distance:    Right Eye Near:   Left Eye Near:    Bilateral Near:     Physical Exam  Constitutional: He is oriented to person, place, and time. He appears well-developed and well-nourished.  HENT:  Head: Normocephalic and atraumatic.  Eyes: EOM are normal.  Neck: Normal range of motion.  Cardiovascular: Normal rate.   Pulmonary/Chest: Effort normal.  Musculoskeletal: Normal range of motion. He exhibits no edema or tenderness.  Left index finger: wound to proximal phalanx. Full ROM  Neurological: He is alert and oriented to person, place, and time.  Skin: Skin is warm and dry. Capillary refill takes less than 2 seconds.  Left index finger: jagged 1.5cm laceration to proximal phalanx, volar aspect. No tendon involvement. No foreign bodies seen or palpated.  Psychiatric: He has a normal mood and affect. His behavior is normal.  Nursing note and vitals reviewed.    UC Treatments / Results  Labs (all labs ordered are listed, but only  abnormal results are displayed) Labs Reviewed - No data to display  EKG  EKG Interpretation None       Radiology No results found.  Procedures .Marland KitchenLaceration Repair Date/Time: 10/18/2016 1:45 PM Performed by: Lurene Shadow Authorized by: Donna Christen A   Consent:    Consent obtained:  Verbal   Consent given by:  Patient   Risks discussed:  Pain, infection, poor cosmetic result, poor wound healing, tendon damage and vascular damage   Alternatives discussed:  No treatment Anesthesia (see MAR for exact dosages):    Anesthesia method:  Nerve block and local infiltration   Local anesthetic:  Lidocaine 1% w/o epi   Block location:  Proximal phalanx of Left index finger at largest section of wound   Block needle gauge:  25 G   Block anesthetic:  Lidocaine 1% w/o epi   Block technique:  Digital   Block injection procedure:  Anatomic landmarks identified, introduced needle, incremental injection, negative aspiration for blood and anatomic landmarks palpated   Block outcome:  Incomplete block Laceration details:    Location:  Finger   Finger location:  L index finger   Length (cm):  1.5   Depth (mm):  2 Pre-procedure details:    Preparation:  Patient was prepped and draped in usual sterile fashion Exploration:    Hemostasis achieved with:  Direct pressure   Wound exploration: wound explored through full range of motion and entire depth of wound probed and visualized     Wound extent: no areolar tissue violation noted, no fascia violation noted, no foreign bodies/material noted, no muscle damage noted, no nerve damage noted, no tendon damage noted, no underlying fracture noted and no vascular damage noted     Contaminated: no   Treatment:    Area cleansed with:  Saline and Betadine (heavily diluted betadine)   Amount of cleaning:  Standard   Irrigation solution:  Sterile saline   Irrigation method:  Syringe Skin repair:    Repair method:  Sutures   Suture size:  4-0   Suture  material:  Prolene   Suture technique:  Simple interrupted   Number of sutures:  3 Approximation:    Approximation:  Close   Vermilion border: well-aligned   Post-procedure details:    Dressing:  Antibiotic ointment, non-adherent dressing and bulky dressing   Patient tolerance of procedure:  Tolerated well, no immediate complications   (  including critical care time)  Medications Ordered in UC Medications - No data to display   Initial Impression / Assessment and Plan / UC Course  I have reviewed the triage vital signs and the nursing notes.  Pertinent labs & imaging results that were available during my care of the patient were reviewed by me and considered in my medical decision making (see chart for details).     Laceration to Left index finger w/o tendon involvement. Sutured closed w/o complication  Final Clinical Impressions(s) / UC Diagnoses   Final diagnoses:  Laceration of left index finger without foreign body without damage to nail, initial encounter   Home care instructions provided. Encouraged f/u with PCP or Urgent Care in 10-14 days of suture removal.   New Prescriptions Discharge Medication List as of 10/18/2016 12:37 PM       Controlled Substance Prescriptions [place] Controlled Substance Registry consulted? Not Applicable   Rolla Plate 10/18/16 1350

## 2016-10-18 NOTE — Discharge Instructions (Signed)
  You may take 500mg acetaminophen every 4-6 hours or in combination with ibuprofen 400-600mg every 6-8 hours as needed for pain and inflammation.  

## 2016-10-18 NOTE — ED Triage Notes (Signed)
Pt c/o LT 2nd finger laceration x 1000 today. He report cutting it with a hedge trimmer at home. Reports Tdap less than 1 year ago.

## 2016-10-30 ENCOUNTER — Encounter: Payer: Self-pay | Admitting: Emergency Medicine

## 2016-10-30 ENCOUNTER — Emergency Department
Admission: EM | Admit: 2016-10-30 | Discharge: 2016-10-30 | Disposition: A | Discharge status: Home / Self Care | Payer: Medicare HMO | Source: Home / Self Care

## 2016-10-30 NOTE — ED Triage Notes (Signed)
Patient came to have 3 sutures removed from left index finger.
